# Patient Record
Sex: Male | Born: 1995 | Hispanic: No | Marital: Single | Smoking: Current some day smoker
Health system: Southern US, Community
[De-identification: ages and names within clinical notes are randomized; demographics above are authoritative.]

## PROBLEM LIST (undated history)

## (undated) DIAGNOSIS — R609 Edema, unspecified: Secondary | ICD-10-CM

## (undated) DIAGNOSIS — R21 Rash and other nonspecific skin eruption: Secondary | ICD-10-CM

---
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
## 2017-08-06 ENCOUNTER — Emergency Department (HOSPITAL_COMMUNITY)
Admission: EM | Admit: 2017-08-06 | Discharge: 2017-08-06 | Disposition: A | Discharge status: Home / Self Care | Payer: Self-pay | Attending: Emergency Medicine | Admitting: Emergency Medicine

## 2017-08-06 ENCOUNTER — Encounter (HOSPITAL_COMMUNITY): Payer: Self-pay | Admitting: Emergency Medicine

## 2017-08-06 ENCOUNTER — Other Ambulatory Visit: Payer: Self-pay

## 2017-08-06 DIAGNOSIS — K529 Noninfective gastroenteritis and colitis, unspecified: Secondary | ICD-10-CM | POA: Insufficient documentation

## 2017-08-06 LAB — COMPREHENSIVE METABOLIC PANEL
ALT: 28 U/L (ref 17–63)
ANION GAP: 11 (ref 5–15)
AST: 28 U/L (ref 15–41)
Albumin: 4 g/dL (ref 3.5–5.0)
Alkaline Phosphatase: 83 U/L (ref 38–126)
BILIRUBIN TOTAL: 0.7 mg/dL (ref 0.3–1.2)
BUN: 14 mg/dL (ref 6–20)
CALCIUM: 8.9 mg/dL (ref 8.9–10.3)
CO2: 23 mmol/L (ref 22–32)
Chloride: 101 mmol/L (ref 101–111)
Creatinine, Ser: 1.06 mg/dL (ref 0.61–1.24)
GFR calc non Af Amer: >60 mL/min (ref >60)
Glucose, Bld: 97 mg/dL (ref 65–99)
POTASSIUM: 4 mmol/L (ref 3.5–5.1)
Sodium: 135 mmol/L (ref 135–145)
TOTAL PROTEIN: 7.9 g/dL (ref 6.5–8.1)

## 2017-08-06 LAB — CBC
HEMATOCRIT: 45.1 % (ref 39.0–52.0)
HEMOGLOBIN: 15.7 g/dL (ref 13.0–17.0)
MCH: 30.7 pg (ref 26.0–34.0)
MCHC: 34.8 g/dL (ref 30.0–36.0)
MCV: 88.1 fL (ref 78.0–100.0)
Platelets: 205 10*3/uL (ref 150–400)
RBC: 5.12 MIL/uL (ref 4.22–5.81)
RDW: 13.6 % (ref 11.5–15.5)
WBC: 13.4 10*3/uL — ABNORMAL HIGH (ref 4.0–10.5)

## 2017-08-06 LAB — LIPASE, BLOOD: Lipase: 38 U/L (ref 11–51)

## 2017-08-06 MED ORDER — ONDANSETRON 4 MG PO TBDP
4.0000 mg | ORAL_TABLET | Freq: Once | ORAL | Status: AC
  Administered 2017-08-06: 4 mg via ORAL
  Filled 2017-08-06: qty 1

## 2017-08-06 MED ORDER — ONDANSETRON 4 MG PO TBDP: 4.0000 mg | ORAL_TABLET | Freq: Three times a day (TID) | ORAL | 0 refills | Status: DC | PRN

## 2017-08-06 NOTE — ED Notes (Signed)
Pt staes he undersrtands instructions. Home stble with steady gaitr.

## 2017-08-06 NOTE — ED Provider Notes (Signed)
[facility] DEPART[facility] Provider Note   CSN: [id number] Arrival date & time: 08/06/17  0310     History   Chief Complaint Chief Complaint  Patient presents with  . Abdominal Pain  . Emesis  . Diarrhea    HPI Philip Vaughn is a 21 y.o. male.  HPI  21 year old male presents with vomiting and diarrhea and abdominal pain.  About 24 hours ago he started developing some abdominal pain and then had to go the bathroom and had diarrhea.  He had only a few loose stools and has vomited 3 times.  No vomiting since yesterday.  Since then he now complains of no abdominal pain but is concerned because he feels hungry but cannot eat.  He tried to eat some chicken and tacos yesterday but can only have a few bites.  He is able to keep down water and other fluids.  He has not had any fevers, bloody stools, hematemesis, recent travel, or recent antibiotic use.  No one else is sick.  He denies dizziness or urinary symptoms. Has tried pepto-bismol with some relief yesterday. Started to feel worse at around 2 Am so he checked in to ED.  Past Medical History:  Diagnosis Date  . Rash 02/10/2014   arms  . Swelling 02/2014   globular swelling of left lower back    There are no active problems to display for this patient.   Past Surgical History:  Procedure Laterality Date  . MASS EXCISION N/A 02/13/2014   Procedure: EXCISION OF GLOBULAR SWELLING ON BACK LEFT OF MIDLINE;  Surgeon: Jerilynn Mages. Gerald Stabs, MD;  Location: [facility];  Service: Pediatrics;  Laterality: N/A;       Home Medications    Prior to Admission medications   Not on File    Family History Family History  Problem Relation Age of Onset  . Thyroid disease Father   . Asthma Paternal Aunt     Social History Social History   Tobacco Use  . Smoking status: Never Smoker  . Smokeless tobacco: Never Used  Substance Use Topics  . Alcohol use: No  . Drug use: No      Allergies   Patient has no known allergies.   Review of Systems Review of Systems  Constitutional: Negative for fever.  Gastrointestinal: Positive for abdominal pain, diarrhea, nausea and vomiting. Negative for blood in stool.  Genitourinary: Negative for dysuria.  All other systems reviewed and are negative.    Physical Exam Updated Vital Signs BP 137/80   Pulse 63   Temp 98.4 F (36.9 C) (Oral)   Resp 16   Ht 5\' 2"  (1.575 m)   Wt 72.6 kg (160 lb)   SpO2 99%   BMI 29.26 kg/m   Physical Exam  Constitutional: He is oriented to person, place, and time. He appears well-developed and well-nourished.  Non-toxic appearance. He does not appear ill. No distress.  HENT:  Head: Normocephalic and atraumatic.  Right Ear: External ear normal.  Left Ear: External ear normal.  Nose: Nose normal.  Mouth/Throat: Oropharynx is clear and moist.  Eyes: Right eye exhibits no discharge. Left eye exhibits no discharge.  Neck: Neck supple.  Cardiovascular: Normal rate, regular rhythm and normal heart sounds.  Pulmonary/Chest: Effort normal and breath sounds normal.  Abdominal: Soft. There is no tenderness.  Musculoskeletal: He exhibits no edema.  Neurological: He is alert and oriented to person, place, and time.  Skin: Skin is warm and dry.  Nursing note and vitals reviewed.    ED Treatments / Results  Labs (all labs ordered are listed, but only abnormal results are displayed) Labs Reviewed  CBC - Abnormal; Notable for the following components:      Result Value   WBC 13.4 (*)    All other components within normal limits  LIPASE, BLOOD  COMPREHENSIVE METABOLIC PANEL  URINALYSIS, ROUTINE W REFLEX MICROSCOPIC    EKG  EKG Interpretation None       Radiology No results found.  Procedures Procedures (including critical care time)  Medications Ordered in ED Medications  ondansetron (ZOFRAN-ODT) disintegrating tablet 4 mg (not administered)     Initial  Impression / Assessment and Plan / ED Course  I have reviewed the triage vital signs and the nursing notes.  Pertinent labs & imaging results that were available during my care of the patient were reviewed by me and considered in my medical decision making (see chart for details).     Patient is well-appearing.  He has no abdominal tenderness or current pain.  He likely had mild viral gastroenteritis but seems to be improving.  While he does still seem to have some difficulty with eating, he is tolerating oral fluids.  I will give him Zofran for symptomatic support but otherwise discussed supportive care and fluids at home. Start with more liquid food such as soup until is more ready to eat.  Return precautions.    Final Clinical Impressions(s) / ED Diagnoses   Final diagnoses:  Gastroenteritis    ED Discharge Orders    None       Sherwood Gambler, MD 08/06/17 [phone]

## 2017-08-06 NOTE — ED Triage Notes (Signed)
Patient arrives with complaint of abdominal pain, nausea, vomiting, and diarrhea for 1 day. Denies sick contacts.

## 2019-07-11 ENCOUNTER — Other Ambulatory Visit: Payer: Self-pay

## 2019-07-11 DIAGNOSIS — Z20822 Contact with and (suspected) exposure to covid-19: Secondary | ICD-10-CM

## 2019-07-13 LAB — NOVEL CORONAVIRUS, NAA: SARS-CoV-2, NAA: NOT DETECTED

## 2023-01-15 ENCOUNTER — Encounter (HOSPITAL_COMMUNITY): Payer: Self-pay

## 2023-01-15 ENCOUNTER — Ambulatory Visit (HOSPITAL_COMMUNITY)
Admission: RE | Admit: 2023-01-15 | Discharge: 2023-01-15 | Disposition: A | Discharge status: Home / Self Care | Payer: Self-pay | Source: Ambulatory Visit | Attending: Emergency Medicine | Admitting: Emergency Medicine

## 2023-01-15 VITALS — BP 134/84 | HR 82 | Temp 98.5°F | Resp 18

## 2023-01-15 DIAGNOSIS — H60392 Other infective otitis externa, left ear: Secondary | ICD-10-CM

## 2023-01-15 MED ORDER — IBUPROFEN 800 MG PO TABS: 800.0000 mg | ORAL_TABLET | Freq: Three times a day (TID) | ORAL | 0 refills | Status: AC

## 2023-01-15 MED ORDER — NEOMYCIN-POLYMYXIN-HC 3.5-10000-1 OT SUSP: 4.0000 [drp] | Freq: Four times a day (QID) | OTIC | 0 refills | Status: AC

## 2023-01-15 NOTE — ED Provider Notes (Signed)
[facility]    CSN: [id number] Arrival date & time: 01/15/23  1257      History   Chief Complaint Chief Complaint  Patient presents with   Appointment   Otalgia    HPI Philip Vaughn is a 27 y.o. male.   Patient presents to clinic for left ear pain that has been worsening over the past 2 weeks.  Two weeks ago he cleaned some water out of his ear with a paper towel.  He tried this again and was unable to get anything out, but the pain remains.  He denies drainage, fevers, or excess wax buildup.  Reports pain is currently mild.      The history is provided by the patient and medical records.  Otalgia Associated symptoms: no ear discharge and no fever     Past Medical History:  Diagnosis Date   Rash 02/10/2014   arms   Swelling 02/2014   globular swelling of left lower back    There are no problems to display for this patient.   Past Surgical History:  Procedure Laterality Date   MASS EXCISION N/A 02/13/2014   Procedure: EXCISION OF GLOBULAR SWELLING ON BACK LEFT OF MIDLINE;  Surgeon: Judie Petit. Leonia Corona, MD;  Location: [facility];  Service: Pediatrics;  Laterality: N/A;       Home Medications    Prior to Admission medications   Medication Sig Start Date End Date Taking? Authorizing Provider  ibuprofen (ADVIL) 800 MG tablet Take 1 tablet (800 mg total) by mouth 3 (three) times daily. 01/15/23  Yes Rinaldo Ratel, [place] N, FNP  neomycin-polymyxin-hydrocortisone (CORTISPORIN) 3.5-10000-1 OTIC suspension Place 4 drops into the left ear 4 (four) times daily for 7 days. 01/15/23 01/22/23 Yes Mosie Angus, [place] N, FNP    Family History Family History  Problem Relation Age of Onset   Thyroid disease Father    Asthma Paternal Aunt     Social History Social History   Tobacco Use   Smoking status: Some Days    Types: Cigars   Smokeless tobacco: Never  Vaping Use   Vaping Use: Never used  Substance Use Topics   Alcohol use: No    Drug use: No     Allergies   Patient has no known allergies.   Review of Systems Review of Systems  Constitutional:  Negative for fever.  HENT:  Positive for ear pain. Negative for ear discharge.      Physical Exam Triage Vital Signs ED Triage Vitals  Enc Vitals Group     BP 01/15/23 1326 134/84     Pulse Rate 01/15/23 1326 82     Resp 01/15/23 1326 18     Temp 01/15/23 1326 98.5 F (36.9 C)     Temp Source 01/15/23 1326 Oral     SpO2 01/15/23 1326 97 %     Weight --      Height --      Head Circumference --      Peak Flow --      Pain Score 01/15/23 1324 6     Pain Loc --      Pain Edu? --      Excl. in GC? --    No data found.  Updated Vital Signs BP 134/84 (BP Location: Left Arm)   Pulse 82   Temp 98.5 F (36.9 C) (Oral)   Resp 18   SpO2 97%   Visual Acuity Right Eye Distance:   Left Eye Distance:  Bilateral Distance:    Right Eye Near:   Left Eye Near:    Bilateral Near:     Physical Exam Vitals and nursing note reviewed.  Constitutional:      Appearance: Normal appearance.  HENT:     Head: Normocephalic and atraumatic.     Right Ear: Tympanic membrane, ear canal and external ear normal.     Left Ear: Drainage, swelling and tenderness present.     Ears:     Comments: Left external auditory canal with tenderness to pinna and tragus manipulation, swelling and scant drainage in canal.  Tympanic membrane intact.    Nose: Nose normal.     Mouth/Throat:     Mouth: Mucous membranes are moist.  Eyes:     Conjunctiva/sclera: Conjunctivae normal.  Cardiovascular:     Rate and Rhythm: Normal rate.  Pulmonary:     Effort: Pulmonary effort is normal. No respiratory distress.  Musculoskeletal:     Cervical back: Normal range of motion.  Lymphadenopathy:     Cervical: No cervical adenopathy.  Skin:    General: Skin is warm and dry.  Neurological:     General: No focal deficit present.     Mental Status: He is alert and oriented to person, place,  and time.  Psychiatric:        Mood and Affect: Mood normal.        Behavior: Behavior normal. Behavior is cooperative.      UC Treatments / Results  Labs (all labs ordered are listed, but only abnormal results are displayed) Labs Reviewed - No data to display  EKG   Radiology No results found.  Procedures Procedures (including critical care time)  Medications Ordered in UC Medications - No data to display  Initial Impression / Assessment and Plan / UC Course  I have reviewed the triage vital signs and the nursing notes.  Pertinent labs & imaging results that were available during my care of the patient were reviewed by me and considered in my medical decision making (see chart for details).  Vitals and triage reviewed, patient is hemodynamically stable.  Left external ear canal with drainage, tenderness and swelling.  Will cover with Cortisporin and ibuprofen for pain.  Encouraged to follow-up with PCP.  Symptomatic management discussed, plan of care, return precautions and follow-up care given, no questions at this time.    Final Clinical Impressions(s) / UC Diagnoses   Final diagnoses:  Other infective acute otitis externa of left ear     Discharge Instructions      You have an external ear infection of your left ear.  Please take all antibiotics as prescribed and until finished.  Please do not allow water in your ear, you can wear earplugs or cotton swab when showering.  Do not submerge your ear in water until your ear infection is fully healed, this means no pools, swimming or baths.  You can take ibuprofen and or milligrams 3 times daily for pain.  You can visit GoodRx.com to find discounts on prescription medication, like coupons for medications.  Sometimes this is cheaper than insurance.  I have attached information for [facility], please reach out to them to get established with a primary care provider and for reevaluation.  He  can return to clinic for any new or concerning symptoms.      ED Prescriptions     Medication Sig Dispense Auth. Provider   neomycin-polymyxin-hydrocortisone (CORTISPORIN) 3.5-10000-1 OTIC suspension Place 4  drops into the left ear 4 (four) times daily for 7 days. 10 mL Rinaldo Ratel, [place] N, FNP   ibuprofen (ADVIL) 800 MG tablet Take 1 tablet (800 mg total) by mouth 3 (three) times daily. 21 tablet Charlye Spare, [place] N, [place]      PDMP not reviewed this encounter.   Nilesh Stegall, [place] N, [place] 01/15/23 [phone]

## 2023-01-15 NOTE — Discharge Instructions (Addendum)
You have an external ear infection of your left ear.  Please take all antibiotics as prescribed and until finished.  Please do not allow water in your ear, you can wear earplugs or cotton swab when showering.  Do not submerge your ear in water until your ear infection is fully healed, this means no pools, swimming or baths.  You can take ibuprofen and or milligrams 3 times daily for pain.  You can visit GoodRx.com to find discounts on prescription medication, like coupons for medications.  Sometimes this is cheaper than insurance.  I have attached information for [facility], please reach out to them to get established with a primary care provider and for reevaluation.  He can return to clinic for any new or concerning symptoms.

## 2023-01-15 NOTE — ED Triage Notes (Signed)
Patient having aching ear pain and hearing loss in the left ear. Onset 2 weeks ago. No h/o ear problems. Patient has not inserted anything in the ear. States he cleans his ear with a paper towel.

## 2024-07-04 ENCOUNTER — Emergency Department (HOSPITAL_COMMUNITY): Payer: Self-pay

## 2024-07-04 ENCOUNTER — Emergency Department (HOSPITAL_COMMUNITY)
Admission: EM | Admit: 2024-07-04 | Discharge: 2024-07-04 | Disposition: A | Discharge status: Home / Self Care | Payer: Self-pay | Attending: Emergency Medicine | Admitting: Emergency Medicine

## 2024-07-04 ENCOUNTER — Other Ambulatory Visit: Payer: Self-pay

## 2024-07-04 DIAGNOSIS — Y9366 Activity, soccer: Secondary | ICD-10-CM | POA: Insufficient documentation

## 2024-07-04 DIAGNOSIS — W19XXXA Unspecified fall, initial encounter: Secondary | ICD-10-CM | POA: Insufficient documentation

## 2024-07-04 DIAGNOSIS — R040 Epistaxis: Secondary | ICD-10-CM | POA: Insufficient documentation

## 2024-07-04 DIAGNOSIS — Y906 Blood alcohol level of 120-199 mg/100 ml: Secondary | ICD-10-CM | POA: Insufficient documentation

## 2024-07-04 DIAGNOSIS — M79632 Pain in left forearm: Secondary | ICD-10-CM | POA: Insufficient documentation

## 2024-07-04 DIAGNOSIS — S0990XA Unspecified injury of head, initial encounter: Secondary | ICD-10-CM | POA: Insufficient documentation

## 2024-07-04 DIAGNOSIS — S52502A Unspecified fracture of the lower end of left radius, initial encounter for closed fracture: Secondary | ICD-10-CM | POA: Insufficient documentation

## 2024-07-04 LAB — CBC
HCT: 47.9 % (ref 39.0–52.0)
Hemoglobin: 16.7 g/dL (ref 13.0–17.0)
MCH: 30.7 pg (ref 26.0–34.0)
MCHC: 34.9 g/dL (ref 30.0–36.0)
MCV: 88.1 fL (ref 80.0–100.0)
Platelets: 293 K/uL (ref 150–400)
RBC: 5.44 MIL/uL (ref 4.22–5.81)
RDW: 12.9 % (ref 11.5–15.5)
WBC: 11 K/uL — ABNORMAL HIGH (ref 4.0–10.5)
nRBC: 0 % (ref 0.0–0.2)

## 2024-07-04 LAB — BASIC METABOLIC PANEL WITH GFR
Anion gap: 17 — ABNORMAL HIGH (ref 5–15)
BUN: 11 mg/dL (ref 6–20)
CO2: 21 mmol/L — ABNORMAL LOW (ref 22–32)
Calcium: 9.3 mg/dL (ref 8.9–10.3)
Chloride: 101 mmol/L (ref 98–111)
Creatinine, Ser: 0.94 mg/dL (ref 0.61–1.24)
GFR, Estimated: >60 mL/min (ref >60)
Glucose, Bld: 97 mg/dL (ref 70–99)
Potassium: 3.7 mmol/L (ref 3.5–5.1)
Sodium: 139 mmol/L (ref 135–145)

## 2024-07-04 LAB — ETHANOL: Alcohol, Ethyl (B): 165 mg/dL — ABNORMAL HIGH (ref <15)

## 2024-07-04 MED ORDER — HYDROMORPHONE HCL 1 MG/ML IJ SOLN
0.5000 mg | Freq: Once | INTRAMUSCULAR | Status: AC
  Administered 2024-07-04: 0.5 mg via INTRAVENOUS
  Filled 2024-07-04: qty 1

## 2024-07-04 NOTE — ED Triage Notes (Signed)
 According to ptar: pt was outside his home kicking a soccer when he fell injuring himself. Pt now has a deformity to his left wrist. Reports last drink last night but ems reports smells of etoh. No head impact reported.  Vitals: 170/100 Hr 110 98% room air Rr 18

## 2024-07-04 NOTE — Progress Notes (Signed)
 Orthopedic Tech Progress Note Patient Details:  Philip Vaughn [facility] 1995-09-28 [id number]  Well-padded plaster sugar tong splint and sling placed to the LUE. Motion and sensation of his fingers remain intact. Ice and elevation encouraged. Pt expressed understanding of needing to following up with ortho/hand MD.  Ortho Devices Type of Ortho Device: Arm sling, Sugartong splint Ortho Device/Splint Location: LUE Ortho Device/Splint Interventions: Ordered, Application, Adjustment   Post Interventions Patient Tolerated: Fair Instructions Provided: Care of device, Adjustment of device  Kymberly Blomberg Ronal Brasil 07/04/2024, 4:02 PM

## 2024-07-04 NOTE — ED Notes (Addendum)
 Cast has been placed by ortho tech. Pt is ready to go home. No new onset distress at this time.

## 2024-07-04 NOTE — ED Notes (Signed)
 Waiting for ortho tech, bus pass given pt may call parents instead.

## 2024-07-04 NOTE — ED Provider Notes (Signed)
 [place] [facility] Provider Note   CSN: [id number] Arrival date & time: 07/04/24  1330     Patient presents with: No chief complaint on file.   Philip Vaughn is a 28 y.o. male.   HPI Patient presents after fall.  Reportedly fell playing soccer.  Left forearm pain injury.  Deformity.  Reportedly per EMS has been drinking.  States he did hit his face.  Has some blood on his right nose.  Difficulty moving the left hand at the wrist due to the pain.   Past Medical History:  Diagnosis Date   Rash 02/10/2014   arms   Swelling 02/2014   globular swelling of left lower back    Prior to Admission medications   Medication Sig Start Date End Date Taking? Authorizing Provider  ibuprofen  (ADVIL ) 800 MG tablet Take 1 tablet (800 mg total) by mouth 3 (three) times daily. 01/15/23   Dreama Alberto SAILOR, FNP    Allergies: Patient has no known allergies.    Review of Systems  Updated Vital Signs BP (!) 123/59 (BP Location: Right Arm)   Pulse 89   Temp 98.6 F (37 C)   Resp 14   Ht 5' 3 (1.6 m)   Wt 84.4 kg   SpO2 97%   BMI 32.95 kg/m   Physical Exam Vitals reviewed.  HENT:     Head:     Comments: Some tenderness to the nose.  Dried blood on right nostril. Chest:     Chest wall: No tenderness.  Abdominal:     Tenderness: There is no abdominal tenderness.  Musculoskeletal:        General: Tenderness present.     Cervical back: Neck supple. No tenderness.     Comments:  tenderness to left mid wrist and to distal forearm.  Sensation grossly intact.  Difficulty moving hand due to the pain.  No elbow tenderness.  No shoulder tenderness.  Neurological:     Mental Status: He is alert.     (all labs ordered are listed, but only abnormal results are displayed) Labs Reviewed  BASIC METABOLIC PANEL WITH GFR - Abnormal; Notable for the following components:      Result Value   CO2 21 (*)    Anion gap 17 (*)    All other components  within normal limits  ETHANOL - Abnormal; Notable for the following components:   Alcohol, Ethyl (B) 165 (*)    All other components within normal limits  CBC - Abnormal; Notable for the following components:   WBC 11.0 (*)    All other components within normal limits    EKG: None  Radiology: CT HEAD WO CONTRAST ( ) Result Date: 07/04/2024 CLINICAL DATA:  Trauma.  Fall. EXAM: CT HEAD WITHOUT CONTRAST CT MAXILLOFACIAL WITHOUT CONTRAST CT CERVICAL SPINE WITHOUT CONTRAST TECHNIQUE: Multidetector CT imaging of the head, cervical spine, and maxillofacial structures were performed using the standard protocol without intravenous contrast. Multiplanar CT image reconstructions of the cervical spine and maxillofacial structures were also generated. RADIATION DOSE REDUCTION: This exam was performed according to the departmental dose-optimization program which includes automated exposure control, adjustment of the mA and/or kV according to patient size and/or use of iterative reconstruction technique. COMPARISON:  None Available. FINDINGS: CT HEAD FINDINGS Brain: No evidence of acute infarction, hemorrhage, hydrocephalus, extra-axial collection or mass lesion/mass effect. Vascular: No hyperdense vessel or unexpected calcification. Skull: Normal. Negative for fracture or focal lesion. Other: None. CT MAXILLOFACIAL FINDINGS  Osseous: No fracture or mandibular dislocation. No destructive process. Orbits: Negative. No traumatic or inflammatory finding. Sinuses: Clear. Soft tissues: Unremarkable. CT CERVICAL SPINE FINDINGS Alignment: Normal. Skull base and vertebrae: No acute fracture. No primary bone lesion or focal pathologic process. Soft tissues and spinal canal: No prevertebral fluid or swelling. No visible canal hematoma. Disc levels: Discs are well maintained in height. No evidence of a disc herniation or significant bulge. No stenosis. Upper chest: Normal. Other: None. IMPRESSION: HEAD CT 1. Normal.  MAXILLOFACIAL CT 1. No fracture or acute finding. CERVICAL CT 1. Normal. Electronically Signed   By: Alm Parkins M.D.   On: 07/04/2024 14:56   CT Maxillofacial Wo Contrast Result Date: 07/04/2024 CLINICAL DATA:  Trauma.  Fall. EXAM: CT HEAD WITHOUT CONTRAST CT MAXILLOFACIAL WITHOUT CONTRAST CT CERVICAL SPINE WITHOUT CONTRAST TECHNIQUE: Multidetector CT imaging of the head, cervical spine, and maxillofacial structures were performed using the standard protocol without intravenous contrast. Multiplanar CT image reconstructions of the cervical spine and maxillofacial structures were also generated. RADIATION DOSE REDUCTION: This exam was performed according to the departmental dose-optimization program which includes automated exposure control, adjustment of the mA and/or kV according to patient size and/or use of iterative reconstruction technique. COMPARISON:  None Available. FINDINGS: CT HEAD FINDINGS Brain: No evidence of acute infarction, hemorrhage, hydrocephalus, extra-axial collection or mass lesion/mass effect. Vascular: No hyperdense vessel or unexpected calcification. Skull: Normal. Negative for fracture or focal lesion. Other: None. CT MAXILLOFACIAL FINDINGS Osseous: No fracture or mandibular dislocation. No destructive process. Orbits: Negative. No traumatic or inflammatory finding. Sinuses: Clear. Soft tissues: Unremarkable. CT CERVICAL SPINE FINDINGS Alignment: Normal. Skull base and vertebrae: No acute fracture. No primary bone lesion or focal pathologic process. Soft tissues and spinal canal: No prevertebral fluid or swelling. No visible canal hematoma. Disc levels: Discs are well maintained in height. No evidence of a disc herniation or significant bulge. No stenosis. Upper chest: Normal. Other: None. IMPRESSION: HEAD CT 1. Normal. MAXILLOFACIAL CT 1. No fracture or acute finding. CERVICAL CT 1. Normal. Electronically Signed   By: Alm Parkins M.D.   On: 07/04/2024 14:56   CT Cervical Spine  Wo Contrast Result Date: 07/04/2024 CLINICAL DATA:  Trauma.  Fall. EXAM: CT HEAD WITHOUT CONTRAST CT MAXILLOFACIAL WITHOUT CONTRAST CT CERVICAL SPINE WITHOUT CONTRAST TECHNIQUE: Multidetector CT imaging of the head, cervical spine, and maxillofacial structures were performed using the standard protocol without intravenous contrast. Multiplanar CT image reconstructions of the cervical spine and maxillofacial structures were also generated. RADIATION DOSE REDUCTION: This exam was performed according to the departmental dose-optimization program which includes automated exposure control, adjustment of the mA and/or kV according to patient size and/or use of iterative reconstruction technique. COMPARISON:  None Available. FINDINGS: CT HEAD FINDINGS Brain: No evidence of acute infarction, hemorrhage, hydrocephalus, extra-axial collection or mass lesion/mass effect. Vascular: No hyperdense vessel or unexpected calcification. Skull: Normal. Negative for fracture or focal lesion. Other: None. CT MAXILLOFACIAL FINDINGS Osseous: No fracture or mandibular dislocation. No destructive process. Orbits: Negative. No traumatic or inflammatory finding. Sinuses: Clear. Soft tissues: Unremarkable. CT CERVICAL SPINE FINDINGS Alignment: Normal. Skull base and vertebrae: No acute fracture. No primary bone lesion or focal pathologic process. Soft tissues and spinal canal: No prevertebral fluid or swelling. No visible canal hematoma. Disc levels: Discs are well maintained in height. No evidence of a disc herniation or significant bulge. No stenosis. Upper chest: Normal. Other: None. IMPRESSION: HEAD CT 1. Normal. MAXILLOFACIAL CT 1. No fracture or acute finding. CERVICAL CT  1. Normal. Electronically Signed   By: Alm Parkins M.D.   On: 07/04/2024 14:56   DG Wrist Complete Left Result Date: 07/04/2024 CLINICAL DATA:  Status post fall with left wrist deformity EXAM: LEFT WRIST - COMPLETE 3 VIEW; LEFT FOREARM - 2 VIEW COMPARISON:  None  Available. FINDINGS: Comminuted, impacted fracture of the distal radial metadiaphysis with proximally 1.8 cm impaction. Minimally displaced avulsion fracture of the ulnar styloid. No acute dislocation. Circumferential soft tissue swelling of the wrist. IMPRESSION: 1. Comminuted, impacted fracture of the distal radial metadiaphysis. 2. Minimally displaced avulsion fracture of the ulnar styloid. Electronically Signed   By: Limin  Xu M.D.   On: 07/04/2024 14:36   DG Forearm Left Result Date: 07/04/2024 CLINICAL DATA:  Status post fall with left wrist deformity EXAM: LEFT WRIST - COMPLETE 3 VIEW; LEFT FOREARM - 2 VIEW COMPARISON:  None Available. FINDINGS: Comminuted, impacted fracture of the distal radial metadiaphysis with proximally 1.8 cm impaction. Minimally displaced avulsion fracture of the ulnar styloid. No acute dislocation. Circumferential soft tissue swelling of the wrist. IMPRESSION: 1. Comminuted, impacted fracture of the distal radial metadiaphysis. 2. Minimally displaced avulsion fracture of the ulnar styloid. Electronically Signed   By: Limin  Xu M.D.   On: 07/04/2024 14:36     Procedures   Medications Ordered in the ED  HYDROmorphone  (DILAUDID ) injection 0.5 mg (0.5 mg Intravenous Given 07/04/24 1413)                                    Medical Decision Making Amount and/or Complexity of Data Reviewed Labs: ordered. Radiology: ordered.  Risk Prescription drug management.   Patient with fall.  Hit face.  Will get CT imaging due to distracting injuries.  Also left wrist and forearm pain.  Some deformity.  Potential fractures.  Will get x-rays to evaluate.  X-rays show distal radius fracture.  Immobilized.  Follow-up with hand surgery.  Pain medicine.      Final diagnoses:  Closed fracture of distal end of left radius, unspecified fracture morphology, initial encounter    ED Discharge Orders     None          Patsey Lot, MD 07/05/24 [phone]
